# Patient Record
Sex: Female | Born: 1990 | Race: Black or African American | Hispanic: No | Marital: Single | Smoking: Never smoker
Health system: Southern US, Community
[De-identification: ages and names within clinical notes are randomized; demographics above are authoritative.]

---

## 2020-08-25 ENCOUNTER — Other Ambulatory Visit: Payer: Self-pay

## 2020-08-25 ENCOUNTER — Encounter (HOSPITAL_COMMUNITY): Payer: Self-pay | Admitting: *Deleted

## 2020-08-25 ENCOUNTER — Emergency Department (HOSPITAL_COMMUNITY): Payer: Managed Care, Other (non HMO)

## 2020-08-25 DIAGNOSIS — R079 Chest pain, unspecified: Secondary | ICD-10-CM | POA: Insufficient documentation

## 2020-08-25 DIAGNOSIS — R42 Dizziness and giddiness: Secondary | ICD-10-CM | POA: Diagnosis not present

## 2020-08-25 DIAGNOSIS — R519 Headache, unspecified: Secondary | ICD-10-CM | POA: Diagnosis present

## 2020-08-25 DIAGNOSIS — I10 Essential (primary) hypertension: Secondary | ICD-10-CM | POA: Insufficient documentation

## 2020-08-25 LAB — BASIC METABOLIC PANEL
Anion gap: 9 (ref 5–15)
BUN: 17 mg/dL (ref 6–20)
CO2: 27 mmol/L (ref 22–32)
Calcium: 9.8 mg/dL (ref 8.9–10.3)
Chloride: 104 mmol/L (ref 98–111)
Creatinine, Ser: 0.86 mg/dL (ref 0.44–1.00)
GFR calc Af Amer: >60 mL/min (ref >60)
GFR calc non Af Amer: >60 mL/min (ref >60)
Glucose, Bld: 107 mg/dL — ABNORMAL HIGH (ref 70–99)
Potassium: 3.9 mmol/L (ref 3.5–5.1)
Sodium: 140 mmol/L (ref 135–145)

## 2020-08-25 LAB — TROPONIN I (HIGH SENSITIVITY): Troponin I (High Sensitivity): <2 ng/L (ref <18)

## 2020-08-25 LAB — CBC
HCT: 34 % — ABNORMAL LOW (ref 36.0–46.0)
Hemoglobin: 11.3 g/dL — ABNORMAL LOW (ref 12.0–15.0)
MCH: 29.3 pg (ref 26.0–34.0)
MCHC: 33.2 g/dL (ref 30.0–36.0)
MCV: 88.1 fL (ref 80.0–100.0)
Platelets: 228 10*3/uL (ref 150–400)
RBC: 3.86 MIL/uL — ABNORMAL LOW (ref 3.87–5.11)
RDW: 14.3 % (ref 11.5–15.5)
WBC: 11 10*3/uL — ABNORMAL HIGH (ref 4.0–10.5)
nRBC: 0 % (ref 0.0–0.2)

## 2020-08-25 LAB — I-STAT BETA HCG BLOOD, ED (NOT ORDERABLE): I-stat hCG, quantitative: <5 m[IU]/mL (ref <5)

## 2020-08-25 NOTE — ED Triage Notes (Addendum)
Pt states that she feels like her heart is racing and she is having headache. Also started having CP that radiates through her shoulder.

## 2020-08-26 ENCOUNTER — Emergency Department (HOSPITAL_COMMUNITY): Payer: Managed Care, Other (non HMO)

## 2020-08-26 ENCOUNTER — Emergency Department (HOSPITAL_COMMUNITY)
Admission: EM | Admit: 2020-08-26 | Discharge: 2020-08-26 | Disposition: A | Discharge status: Home / Self Care | Payer: Managed Care, Other (non HMO) | Attending: Emergency Medicine | Admitting: Emergency Medicine

## 2020-08-26 DIAGNOSIS — I10 Essential (primary) hypertension: Secondary | ICD-10-CM

## 2020-08-26 DIAGNOSIS — R519 Headache, unspecified: Secondary | ICD-10-CM

## 2020-08-26 LAB — D-DIMER, QUANTITATIVE: D-Dimer, Quant: 0.54 ug/mL-FEU — ABNORMAL HIGH (ref 0.00–0.50)

## 2020-08-26 LAB — TROPONIN I (HIGH SENSITIVITY): Troponin I (High Sensitivity): <2 ng/L (ref <18)

## 2020-08-26 MED ORDER — IOHEXOL 350 MG/ML SOLN
75.0000 mL | Freq: Once | INTRAVENOUS | Status: AC | PRN
  Administered 2020-08-26: 75 mL via INTRAVENOUS

## 2020-08-26 MED ORDER — SODIUM CHLORIDE 0.9 % IV BOLUS: 1000.0000 mL | Freq: Once | INTRAVENOUS | Status: DC

## 2020-08-26 MED ORDER — ACETAMINOPHEN 325 MG PO TABS
650.0000 mg | ORAL_TABLET | Freq: Once | ORAL | Status: AC
  Administered 2020-08-26: 650 mg via ORAL
  Filled 2020-08-26: qty 2

## 2020-08-26 MED ORDER — SODIUM CHLORIDE (PF) 0.9 % IJ SOLN
INTRAMUSCULAR | Status: AC
  Filled 2020-08-26: qty 100

## 2020-08-26 MED ORDER — IBUPROFEN 800 MG PO TABS
800.0000 mg | ORAL_TABLET | Freq: Once | ORAL | Status: AC
  Administered 2020-08-26: 800 mg via ORAL
  Filled 2020-08-26: qty 1

## 2020-08-26 NOTE — Discharge Instructions (Signed)
PleaseI have given you a couple of different to follow-up with.  Below you'll find the heart care office.  Please feel free to reach out to them to schedule a follow-up appointment regarding your symptoms.  If you're looking for a primary care provider in the area I would recommend both [facility] as well as [facility].  These are both popular primary care providers in the region.  You can also call [facility].  Many of our patients follow-up with them.  Please continue to monitor your blood pressure.  You can always return to the emergency department if you develop any new or worsening symptoms.  It was a pleasure to meet you.

## 2020-08-26 NOTE — ED Provider Notes (Signed)
[facility]-EMERGENCY DEPT Provider Note   CSN: [id number] Arrival date & time: 08/25/20  2215     History Chief Complaint  Patient presents with  . Headache    Dana Sanchez is a 30 y.o. female.  HPI   Patient is a 30 year old female with no known medical history.  She presents today with multiple complaints.  Patient states about 2 days ago she experienced an episode of "her heart racing".  This spontaneously alleviated and she had a normal day yesterday.  Beginning last night she began experiencing her symptoms once again as well as a left-sided frontal headache, "high blood pressure", left anterior chest pain that worsens with palpation and sometimes radiates down her left arm, lightheadedness.  She came to the emergency department for further evaluation.  She states her headache started at a 8/10 and is now currently 5/10. She feels that her chest pain has somewhat alleviated.  No fevers, chills, shortness of breath, abdominal pain, vomiting, urinary changes, leg swelling, calf pain.  Patient states that she had similar symptoms in March of this year when she was diagnosed with COVID-19.  She states that she received the first COVID-19 vaccine 2 weeks ago and feels that this worsened her symptoms once again.     History reviewed. No pertinent past medical history.  There are no problems to display for this patient.   History reviewed. No pertinent surgical history.   OB History   No obstetric history on file.     No family history on file.  Social History   Tobacco Use  . Smoking status: Never Smoker  Substance Use Topics  . Alcohol use: Not Currently  . Drug use: Not Currently    Home Medications Prior to Admission medications   Not on File    Allergies    Patient has no known allergies.  Review of Systems   Review of Systems  All other systems reviewed and are negative. Ten systems reviewed and are negative for acute change, except as  noted in the HPI.    Physical Exam Updated Vital Signs BP (!) 146/86 (BP Location: Left Arm)   Pulse 85   Temp 99.3 F (37.4 C) (Oral)   Resp 18   LMP 08/09/2020   SpO2 100%   Physical Exam Vitals and nursing note reviewed.  Constitutional:      General: She is not in acute distress.    Appearance: Normal appearance. She is well-developed and normal weight. She is not ill-appearing, toxic-appearing or diaphoretic.  HENT:     Head: Normocephalic and atraumatic.     Right Ear: External ear normal.     Left Ear: External ear normal.     Nose: Nose normal.     Mouth/Throat:     Mouth: Mucous membranes are moist.     Pharynx: Oropharynx is clear. No oropharyngeal exudate or posterior oropharyngeal erythema.  Eyes:     General: No scleral icterus.    Extraocular Movements: Extraocular movements intact.     Right eye: Normal extraocular motion.     Left eye: Normal extraocular motion.     Pupils: Pupils are equal, round, and reactive to light. Pupils are equal.     Right eye: Pupil is round and reactive.     Left eye: Pupil is round and reactive.  Cardiovascular:     Rate and Rhythm: Normal rate and regular rhythm.     Pulses: Normal pulses.     Heart sounds: Normal  heart sounds. No murmur heard.  No friction rub. No gallop.      Comments: Regular rate and rhythm.  No murmurs, rubs, gallops.  Heart rate in the 80s on my exam. Pulmonary:     Effort: Pulmonary effort is normal. No respiratory distress.     Breath sounds: Normal breath sounds. No stridor. No wheezing, rhonchi or rales.     Comments: Mild TTP noted along the left anterior chest wall.  No crepitus. Chest:     Chest wall: Tenderness present.  Abdominal:     General: Abdomen is flat.     Tenderness: There is no abdominal tenderness.  Musculoskeletal:        General: No swelling. Normal range of motion.     Cervical back: Normal range of motion and neck supple. No tenderness.     Comments: No calf pain or pedal  edema noted.  Palpable DP pulses noted bilaterally.  Skin:    General: Skin is warm and dry.  Neurological:     General: No focal deficit present.     Mental Status: She is alert and oriented to person, place, and time. Mental status is at baseline.     GCS: GCS eye subscore is 4. GCS verbal subscore is 5. GCS motor subscore is 6.  Psychiatric:        Mood and Affect: Mood normal.        Behavior: Behavior normal.    ED Results / Procedures / Treatments   Labs (all labs ordered are listed, but only abnormal results are displayed) Labs Reviewed  BASIC METABOLIC PANEL - Abnormal; Notable for the following components:      Result Value   Glucose, Bld 107 (*)    All other components within normal limits  CBC - Abnormal; Notable for the following components:   WBC 11.0 (*)    RBC 3.86 (*)    Hemoglobin 11.3 (*)    HCT 34.0 (*)    All other components within normal limits  D-DIMER, QUANTITATIVE (NOT AT [facility]) - Abnormal; Notable for the following components:   D-Dimer, Quant 0.54 (*)    All other components within normal limits  I-STAT BETA HCG BLOOD, ED ([facility], [facility], [facility] ONLY)  I-STAT BETA HCG BLOOD, ED (NOT ORDERABLE)  TROPONIN I (HIGH SENSITIVITY)  TROPONIN I (HIGH SENSITIVITY)   EKG EKG Interpretation  Date/Time:  Thursday August 25 2020 23:40:37 EDT Ventricular Rate:  84 PR Interval:    QRS Duration: 72 QT Interval:  355 QTC Calculation: 420 R Axis:   61 Text Interpretation: Sinus rhythm No previous ECGs available Confirmed by Zadie Rhine ([id number]) on 08/26/2020 12:26:08 AM   Radiology DG Chest 2 View  Result Date: 08/25/2020 CLINICAL DATA:  30 year old female with chest pain and tachycardia. EXAM: CHEST - 2 VIEW COMPARISON:  None. FINDINGS: The heart size and mediastinal contours are within normal limits. Both lungs are clear. The visualized skeletal structures are unremarkable. IMPRESSION: No active cardiopulmonary disease. Electronically Signed   By: Elgie Collard  M.D.   On: 08/25/2020 23:34   CT Angio Chest PE W and/or Wo Contrast  Result Date: 08/26/2020 CLINICAL DATA:  Chest pain and tachycardia EXAM: CT ANGIOGRAPHY CHEST WITH CONTRAST TECHNIQUE: Multidetector CT imaging of the chest was performed using the standard protocol during bolus administration of intravenous contrast. Multiplanar CT image reconstructions and MIPs were obtained to evaluate the vascular anatomy. CONTRAST:  34mL OMNIPAQUE IOHEXOL 350 MG/ML SOLN COMPARISON:  Chest radiograph August 25, 2020. FINDINGS: Cardiovascular: There is no demonstrable pulmonary embolus. There is no thoracic aortic aneurysm or dissection. The visualized great vessels appear normal. The right innominate and left common carotid arteries arise as a common trunk, an anatomic variant. There is no evident pericardial effusion or pericardial thickening. Mediastinum/Nodes: Thyroid appears normal. There are subcentimeter axillary lymph nodes which do not meet size criteria for pathologic significance. There is no adenopathy by size criteria in the thoracic region. No esophageal lesions are appreciable. Lungs/Pleura: No pneumothorax. Trachea and major bronchial structures appear patent. Lungs are clear. No pleural effusions evident. Upper Abdomen: Gallbladder absent. Visualized upper abdominal structures otherwise normal. Musculoskeletal: No blastic or lytic bone lesions. No evident chest wall lesions. Review of the MIP images confirms the above findings. IMPRESSION: 1. No demonstrable pulmonary embolus. No thoracic aortic aneurysm or dissection. 2.  Lungs clear. 3.  No appreciable adenopathy. 4.  Gallbladder absent. Electronically Signed   By: Bretta BangWilliam  Woodruff III M.D.   On: 08/26/2020 10:13   Procedures Procedures (including critical care time)  Medications Ordered in ED Medications  sodium chloride (PF) 0.9 % injection (  Not Given 08/26/20 1025)  acetaminophen (TYLENOL) tablet 650 mg (650 mg Oral Given 08/26/20 0811)    ibuprofen (ADVIL) tablet 800 mg (800 mg Oral Given 08/26/20 0811)  iohexol (OMNIPAQUE) 350 MG/ML injection 75 mL (75 mLs Intravenous Contrast Given 08/26/20 0959)   ED Course  I have reviewed the triage vital signs and the nursing notes.  Pertinent labs & imaging results that were available during my care of the patient were reviewed by me and considered in my medical decision making (see chart for details).  Clinical Course as of Aug 27 1111  Fri Aug 26, 2020  0825 Discussed with my attending physician. [place] as well as her sx am going to obtain a d-dimer on the pt. Discussed with her and she is amenable.    [LJ]  [id number] Discussed with the patient and she would like to move forward with CTA of the chest to rule out PE.  D-Dimer, Quant(!): 0.54 [LJ]  1022 1. No demonstrable pulmonary embolus. No thoracic aortic aneurysm or dissection. 2. Lungs clear. 3. No appreciable adenopathy. 4. Gallbladder absent.  CT Angio Chest PE W and/or Wo Contrast [LJ]    Clinical Course User Index [LJ] Placido SouJoldersma, Saban Heinlen, PA-C   MDM Rules/Calculators/A&P                          Pt is a 30 y.o. female that presents with a history, physical exam, and ED Clinical Course as noted above.   Patient presents today with multiple complaints including tachycardia, hypertension, left-sided chest pain, lightheadedness, headache.  Due to ER wait times patient was waiting overnight before eventually getting a room.  She states her headache is now mildly alleviated and is currently a 5/10, down from 8/10.  I gave her Tylenol as well as ibuprofen for pain which she states resolved her symptoms.  She was offered IV fluids but declined.  Labs today are reassuring.  She has had a negative troponin x2.  [place].  She is having no leg swelling or calf pain but given her recent travel and  symptoms, obtained a D-dimer.  This was slightly elevated at 0.54.  Discussed the possibility of a  CTA of the chest and patient was amenable.  This was obtained showing no PE.  This was discussed in length with the patient.  She feels comfortable being discharged at this time.  I'm going to give her follow-up with a cardiologist in the area as well as recommendations for primary care providers in the area, since she is new to the region.  Patient is hemodynamically stable and in NAD at the time of d/c. Evaluation does not show pathology that would require ongoing emergent intervention or inpatient treatment. I explained the diagnosis to the patient. Patient is comfortable with above plan and is stable for discharge at this time. All questions were answered prior to disposition. Strict return precautions for returning to the ED were discussed. Encouraged follow up with PCP.    An After Visit Summary was printed and given to the patient.  Patient discharged to home/self care.  Condition at discharge: Stable  Note: Portions of this report may have been transcribed using voice recognition software. Every effort was made to ensure accuracy; however, inadvertent computerized transcription errors may be present.   Final Clinical Impression(s) / ED Diagnoses Final diagnoses:  Bad headache  Lightheadedness  Hypertension, unspecified type   Rx / DC Orders ED Discharge Orders    None       Placido Sou, PA-C 08/26/20 1113    Tegeler, Canary Brim, MD 08/26/20 1216

## 2020-09-13 ENCOUNTER — Emergency Department (HOSPITAL_COMMUNITY): Payer: Managed Care, Other (non HMO)

## 2020-09-13 ENCOUNTER — Emergency Department (HOSPITAL_COMMUNITY)
Admission: EM | Admit: 2020-09-13 | Discharge: 2020-09-13 | Disposition: A | Discharge status: Home / Self Care | Payer: Managed Care, Other (non HMO) | Attending: Emergency Medicine | Admitting: Emergency Medicine

## 2020-09-13 ENCOUNTER — Encounter (HOSPITAL_COMMUNITY): Payer: Self-pay

## 2020-09-13 DIAGNOSIS — Y998 Other external cause status: Secondary | ICD-10-CM | POA: Diagnosis not present

## 2020-09-13 DIAGNOSIS — Y9289 Other specified places as the place of occurrence of the external cause: Secondary | ICD-10-CM | POA: Diagnosis not present

## 2020-09-13 DIAGNOSIS — T148XXA Other injury of unspecified body region, initial encounter: Secondary | ICD-10-CM

## 2020-09-13 DIAGNOSIS — Y9389 Activity, other specified: Secondary | ICD-10-CM | POA: Diagnosis not present

## 2020-09-13 DIAGNOSIS — S0502XA Injury of conjunctiva and corneal abrasion without foreign body, left eye, initial encounter: Secondary | ICD-10-CM | POA: Diagnosis not present

## 2020-09-13 MED ORDER — HYDROCODONE-ACETAMINOPHEN 5-325 MG PO TABS
1.0000 | ORAL_TABLET | Freq: Once | ORAL | Status: AC
  Administered 2020-09-13: 1 via ORAL
  Filled 2020-09-13: qty 1

## 2020-09-13 MED ORDER — TETRACAINE HCL 0.5 % OP SOLN
1.0000 [drp] | Freq: Once | OPHTHALMIC | Status: AC
  Administered 2020-09-13: 1 [drp] via OPHTHALMIC
  Filled 2020-09-13: qty 4

## 2020-09-13 MED ORDER — FLUORESCEIN SODIUM 1 MG OP STRP
1.0000 | ORAL_STRIP | Freq: Once | OPHTHALMIC | Status: AC
  Administered 2020-09-13: 1 via OPHTHALMIC
  Filled 2020-09-13: qty 1

## 2020-09-13 NOTE — ED Notes (Signed)
An After Visit Summary was printed and given to the patient. Discharge instructions given and no further questions at this time.  
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
## 2020-09-13 NOTE — Discharge Instructions (Addendum)
You were evaluated in the Emergency Department and after careful evaluation, we did not find any emergent condition requiring admission or further testing in the hospital.  Your exam/testing today was overall reassuring.  X-rays did not show any broken bones or emergencies.  We recommend Tylenol or Motrin at home for discomfort.  You can use over-the-counter eyedrops if your eye continues to be irritated.  Please return to the Emergency Department if you experience any worsening of your condition.  Thank you for allowing [place] to be a part of your care.
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
## 2020-11-27 IMAGING — CT CT ANGIO CHEST
2 of 6 series · 18 of 36 positions shown · IV contrast (omnipaque)
Comparison: Chest radiograph August 25, 2020.

CLINICAL DATA: Chest pain and tachycardia

EXAM:
CT ANGIOGRAPHY CHEST WITH CONTRAST
TECHNIQUE: Multidetector CT imaging of the chest was performed using the
standard protocol during bolus administration of intravenous
contrast. Multiplanar CT image reconstructions and MIPs were
obtained to evaluate the vascular anatomy.
CONTRAST:  75mL OMNIPAQUE IOHEXOL 350 MG/ML SOLN

[Series 5: thins · axial · 0.82mm/px · z∈[+164,+390]mm · 17 of 255 slices shown]
[im 15/255  lung]
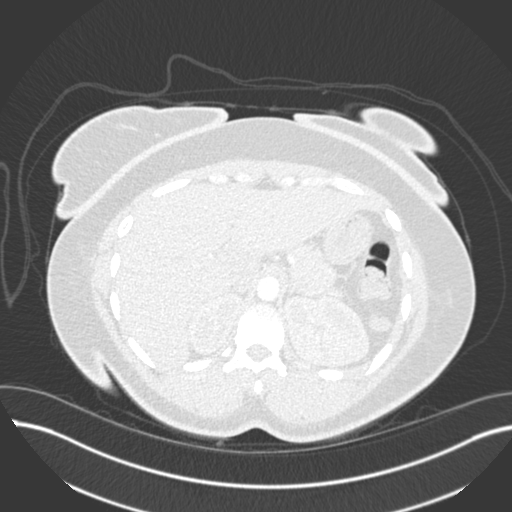
[im 29/255  mediastinal]
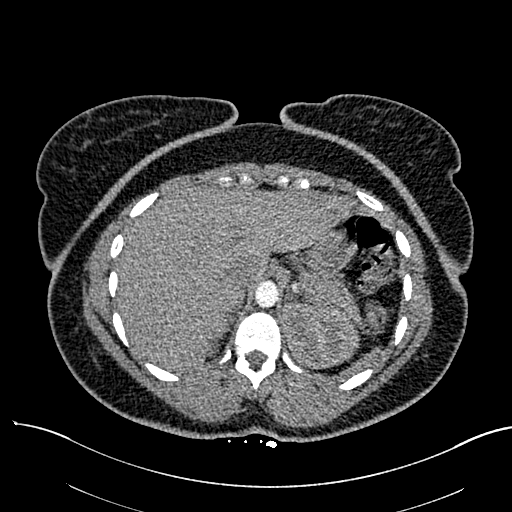
[im 43/255  lung]
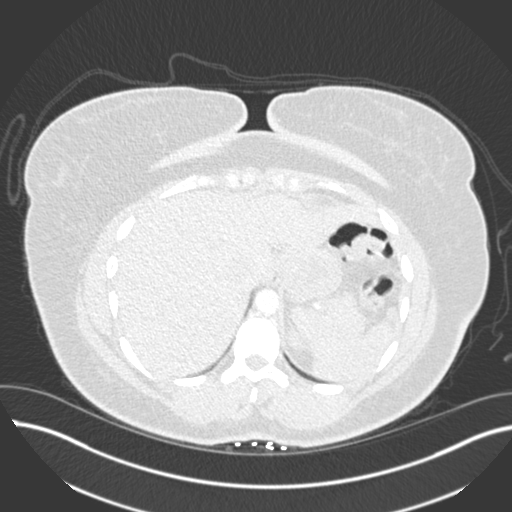
[im 57/255  mediastinal]
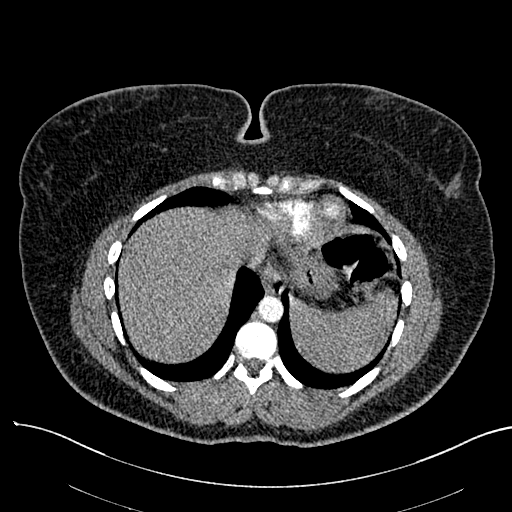
[im 71/255  lung]
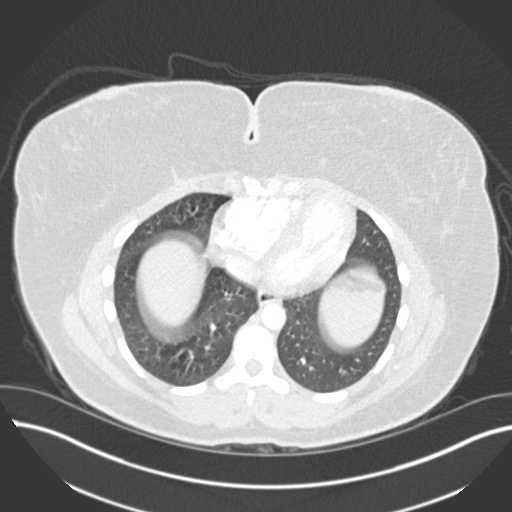
[im 85/255  mediastinal]
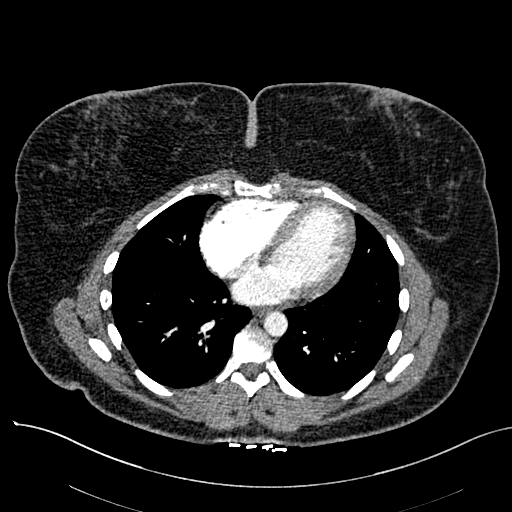
[im 99/255  lung]
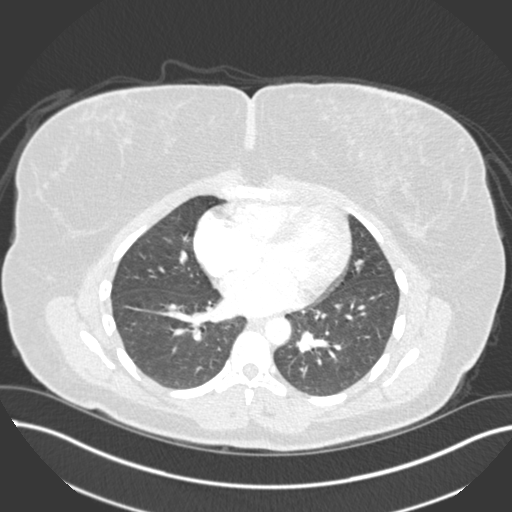
[im 113/255  mediastinal]
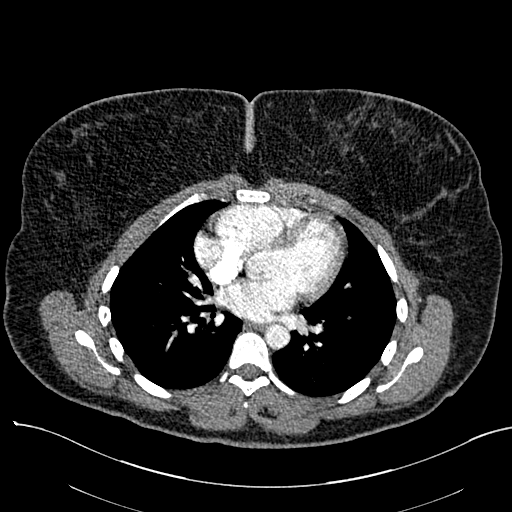
[im 128/255  lung]
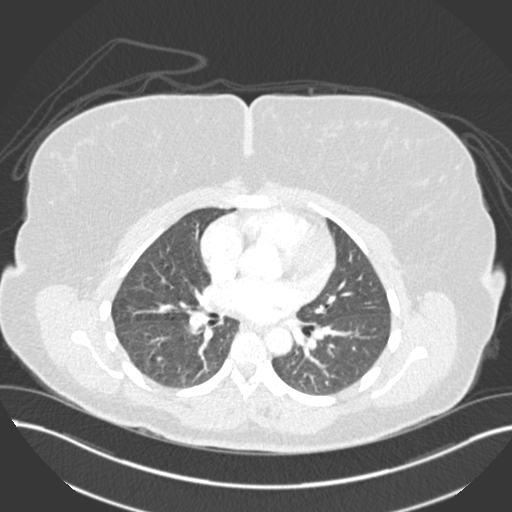
[im 142/255  mediastinal]
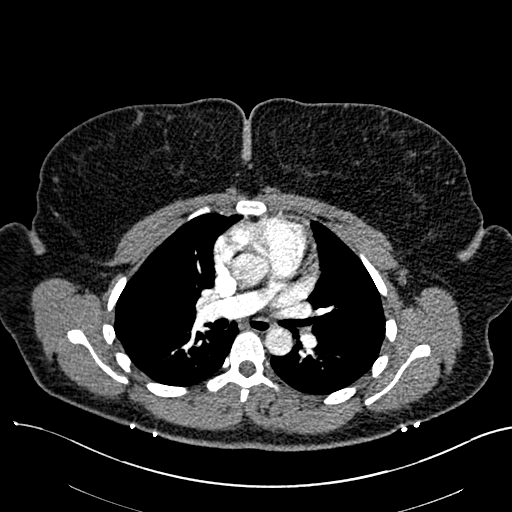
[im 156/255  lung]
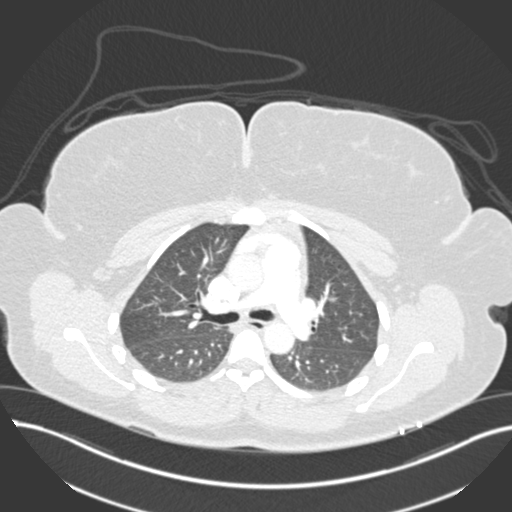
[im 170/255  mediastinal]
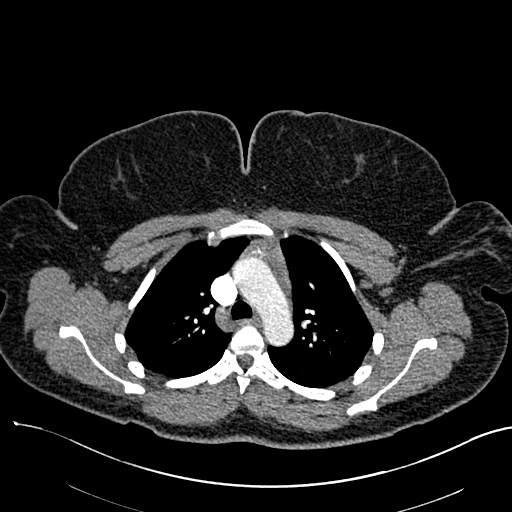
[im 184/255  lung]
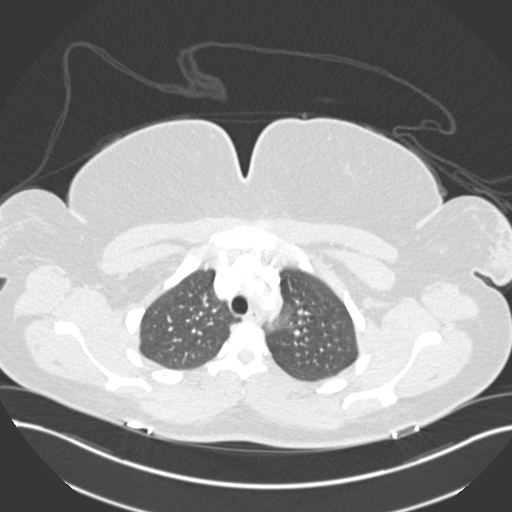
[im 198/255  mediastinal]
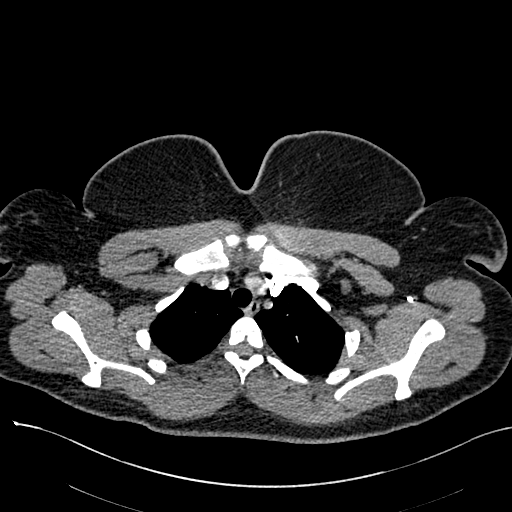
[im 212/255  lung]
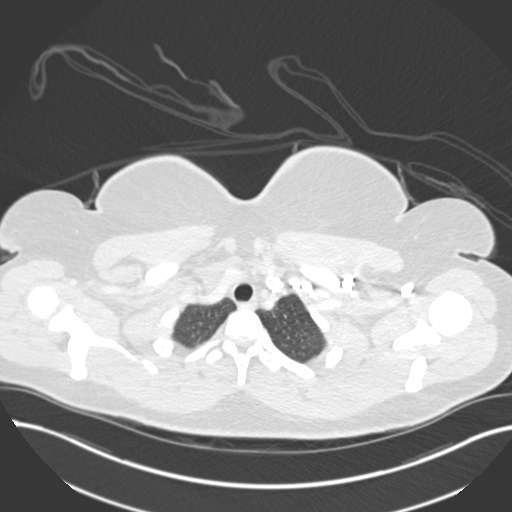
[im 226/255  mediastinal]
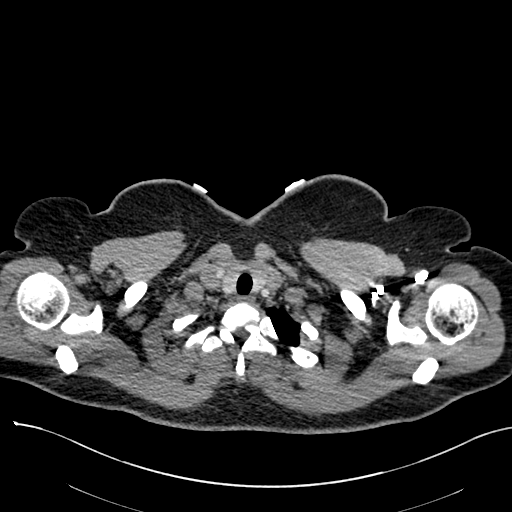
[im 240/255  lung]
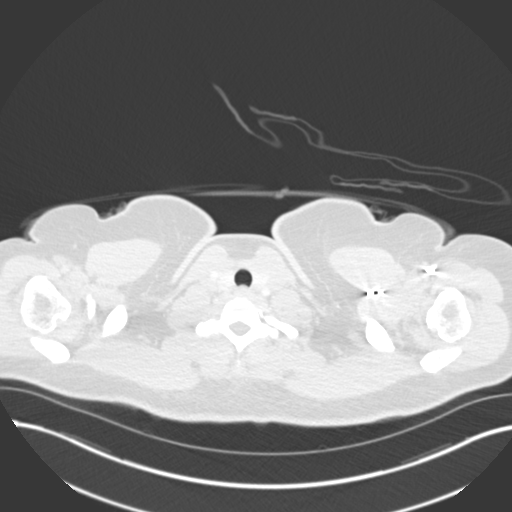

[Series 7: coronal mpr · coronal · 0.60mm/px · 1 of 164 slices shown]
[im 82/164  mediastinal]
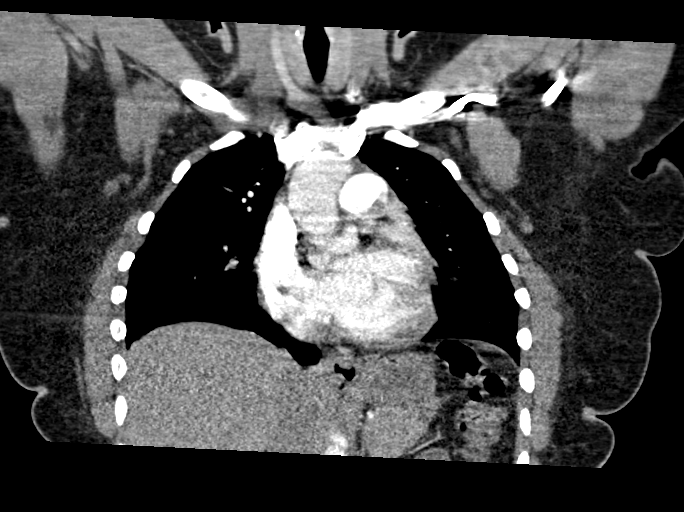

[18 of 36 positions shown; findings below may reference images not displayed]

FINDINGS: Cardiovascular: There is no demonstrable pulmonary embolus. There is
no thoracic aortic aneurysm or dissection. The visualized great
vessels appear normal. The right innominate and left common carotid
arteries arise as a common trunk, an anatomic variant. There is no
evident pericardial effusion or pericardial thickening.

Mediastinum/Nodes: Thyroid appears normal. There are subcentimeter
axillary lymph nodes which do not meet size criteria for pathologic
significance. There is no adenopathy by size criteria in the
thoracic region. No esophageal lesions are appreciable.

Lungs/Pleura: No pneumothorax. Trachea and major bronchial
structures appear patent. Lungs are clear. No pleural effusions
evident.

Upper Abdomen: Gallbladder absent. Visualized upper abdominal
structures otherwise normal.

Musculoskeletal: No blastic or lytic bone lesions. No evident chest
wall lesions.

Review of the MIP images confirms the above findings.
IMPRESSION: 1. No demonstrable pulmonary embolus. No thoracic aortic aneurysm or
dissection.

2.  Lungs clear.

3.  No appreciable adenopathy.

4.  Gallbladder absent.
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
